# Patient Record
Sex: Female | Born: 1950 | Race: White | Hispanic: No | Marital: Married | Smoking: Never smoker
Health system: Southern US, Community
[De-identification: ages and names within clinical notes are randomized; demographics above are authoritative.]

## PROBLEM LIST (undated history)

## (undated) DIAGNOSIS — I10 Essential (primary) hypertension: Secondary | ICD-10-CM

## (undated) DIAGNOSIS — K219 Gastro-esophageal reflux disease without esophagitis: Secondary | ICD-10-CM

## (undated) DIAGNOSIS — K5900 Constipation, unspecified: Secondary | ICD-10-CM

## (undated) DIAGNOSIS — D649 Anemia, unspecified: Secondary | ICD-10-CM

## (undated) DIAGNOSIS — F32A Depression, unspecified: Secondary | ICD-10-CM

## (undated) DIAGNOSIS — C801 Malignant (primary) neoplasm, unspecified: Secondary | ICD-10-CM

## (undated) DIAGNOSIS — F329 Major depressive disorder, single episode, unspecified: Secondary | ICD-10-CM

## (undated) DIAGNOSIS — M1990 Unspecified osteoarthritis, unspecified site: Secondary | ICD-10-CM

## (undated) DIAGNOSIS — F419 Anxiety disorder, unspecified: Secondary | ICD-10-CM

## (undated) DIAGNOSIS — K589 Irritable bowel syndrome without diarrhea: Secondary | ICD-10-CM

## (undated) DIAGNOSIS — R011 Cardiac murmur, unspecified: Secondary | ICD-10-CM

## (undated) HISTORY — PX: COLONOSCOPY: SHX174

## (undated) HISTORY — PX: CHOLECYSTECTOMY: SHX55

## (undated) HISTORY — PX: OVARIAN CYST REMOVAL: SHX89

## (undated) HISTORY — PX: UMBILICAL HERNIA REPAIR: SHX196

## (undated) HISTORY — PX: TONSILLECTOMY: SUR1361

---

## 2009-09-30 ENCOUNTER — Emergency Department (HOSPITAL_COMMUNITY): Admission: EM | Admit: 2009-09-30 | Discharge: 2009-09-30 | Payer: Self-pay | Admitting: Emergency Medicine

## 2010-05-29 LAB — COMPREHENSIVE METABOLIC PANEL WITH GFR
ALT: 24 U/L (ref 0–35)
AST: 20 U/L (ref 0–37)
Albumin: 4 g/dL (ref 3.5–5.2)
Alkaline Phosphatase: 81 U/L (ref 39–117)
BUN: 16 mg/dL (ref 6–23)
CO2: 28 meq/L (ref 19–32)
Calcium: 9.4 mg/dL (ref 8.4–10.5)
Chloride: 106 meq/L (ref 96–112)
Creatinine, Ser: 0.63 mg/dL (ref 0.4–1.2)
GFR calc non Af Amer: >60 mL/min
Glucose, Bld: 109 mg/dL — ABNORMAL HIGH (ref 70–99)
Potassium: 4.7 meq/L (ref 3.5–5.1)
Sodium: 141 meq/L (ref 135–145)
Total Bilirubin: 1.6 mg/dL — ABNORMAL HIGH (ref 0.3–1.2)
Total Protein: 6.7 g/dL (ref 6.0–8.3)

## 2010-05-29 LAB — URINALYSIS, ROUTINE W REFLEX MICROSCOPIC
Bilirubin Urine: NEGATIVE
Glucose, UA: NEGATIVE mg/dL
Hgb urine dipstick: NEGATIVE
Ketones, ur: NEGATIVE mg/dL
Nitrite: NEGATIVE
Protein, ur: NEGATIVE mg/dL
Specific Gravity, Urine: 1.021 (ref 1.005–1.030)
Urobilinogen, UA: 2 mg/dL — ABNORMAL HIGH (ref 0.0–1.0)
pH: 7.5 (ref 5.0–8.0)

## 2010-05-29 LAB — DIFFERENTIAL
Monocytes Relative: 6 % (ref 3–12)
Neutro Abs: 6 10*3/uL (ref 1.7–7.7)

## 2010-05-29 LAB — CBC
HCT: 45.6 % (ref 36.0–46.0)
Hemoglobin: 15.7 g/dL — ABNORMAL HIGH (ref 12.0–15.0)
MCH: 30 pg (ref 26.0–34.0)
MCHC: 34.5 g/dL (ref 30.0–36.0)
MCV: 87 fL (ref 78.0–100.0)
Platelets: 198 10*3/uL (ref 150–400)
RBC: 5.23 MIL/uL — ABNORMAL HIGH (ref 3.87–5.11)
RDW: 14 % (ref 11.5–15.5)
WBC: 7.9 10*3/uL (ref 4.0–10.5)

## 2010-05-29 LAB — URINE MICROSCOPIC-ADD ON

## 2010-05-29 LAB — LIPASE, BLOOD: Lipase: 31 U/L (ref 11–59)

## 2010-05-29 LAB — HEMOCCULT GUIAC POC 1CARD (OFFICE): Fecal Occult Bld: NEGATIVE

## 2011-03-01 ENCOUNTER — Encounter: Payer: Self-pay | Admitting: Emergency Medicine

## 2011-03-01 ENCOUNTER — Emergency Department (HOSPITAL_COMMUNITY)
Admission: EM | Admit: 2011-03-01 | Discharge: 2011-03-01 | Disposition: A | Discharge status: Home / Self Care | Payer: BC Managed Care – PPO | Attending: Emergency Medicine | Admitting: Emergency Medicine

## 2011-03-01 ENCOUNTER — Emergency Department (HOSPITAL_COMMUNITY): Payer: BC Managed Care – PPO

## 2011-03-01 DIAGNOSIS — R112 Nausea with vomiting, unspecified: Secondary | ICD-10-CM | POA: Insufficient documentation

## 2011-03-01 DIAGNOSIS — F3289 Other specified depressive episodes: Secondary | ICD-10-CM | POA: Insufficient documentation

## 2011-03-01 DIAGNOSIS — R1031 Right lower quadrant pain: Secondary | ICD-10-CM | POA: Insufficient documentation

## 2011-03-01 DIAGNOSIS — F329 Major depressive disorder, single episode, unspecified: Secondary | ICD-10-CM | POA: Insufficient documentation

## 2011-03-01 DIAGNOSIS — R197 Diarrhea, unspecified: Secondary | ICD-10-CM | POA: Insufficient documentation

## 2011-03-01 DIAGNOSIS — I10 Essential (primary) hypertension: Secondary | ICD-10-CM | POA: Insufficient documentation

## 2011-03-01 DIAGNOSIS — K219 Gastro-esophageal reflux disease without esophagitis: Secondary | ICD-10-CM | POA: Insufficient documentation

## 2011-03-01 HISTORY — DX: Gastro-esophageal reflux disease without esophagitis: K21.9

## 2011-03-01 HISTORY — DX: Depression, unspecified: F32.A

## 2011-03-01 HISTORY — DX: Essential (primary) hypertension: I10

## 2011-03-01 HISTORY — DX: Major depressive disorder, single episode, unspecified: F32.9

## 2011-03-01 LAB — CBC
HCT: 41.5 % (ref 36.0–46.0)
Hemoglobin: 14.3 g/dL (ref 12.0–15.0)
MCH: 28.9 pg (ref 26.0–34.0)
MCHC: 34.5 g/dL (ref 30.0–36.0)
MCV: 84 fL (ref 78.0–100.0)
Platelets: 162 K/uL (ref 150–400)
RBC: 4.94 MIL/uL (ref 3.87–5.11)
RDW: 14.4 % (ref 11.5–15.5)
WBC: 9.6 K/uL (ref 4.0–10.5)

## 2011-03-01 LAB — COMPREHENSIVE METABOLIC PANEL
ALT: 17 U/L (ref 0–35)
AST: 9 U/L (ref 0–37)
Alkaline Phosphatase: 78 U/L (ref 39–117)
BUN: 17 mg/dL (ref 6–23)
CO2: 29 mEq/L (ref 19–32)
Chloride: 103 mEq/L (ref 96–112)
GFR calc Af Amer: >90 mL/min (ref >90)
GFR calc non Af Amer: >90 mL/min (ref >90)
Glucose, Bld: 110 mg/dL — ABNORMAL HIGH (ref 70–99)
Potassium: 3.1 mEq/L — ABNORMAL LOW (ref 3.5–5.1)
Sodium: 139 mEq/L (ref 135–145)
Total Bilirubin: 1.5 mg/dL — ABNORMAL HIGH (ref 0.3–1.2)

## 2011-03-01 LAB — URINALYSIS, ROUTINE W REFLEX MICROSCOPIC
Bilirubin Urine: NEGATIVE
Glucose, UA: NEGATIVE mg/dL
Hgb urine dipstick: NEGATIVE
Ketones, ur: NEGATIVE mg/dL
Protein, ur: NEGATIVE mg/dL
Urobilinogen, UA: 0.2 mg/dL (ref 0.0–1.0)
pH: 6 (ref 5.0–8.0)

## 2011-03-01 MED ORDER — ONDANSETRON HCL 4 MG/2ML IJ SOLN: 4.0000 mg | Freq: Once | INTRAMUSCULAR | Status: DC

## 2011-03-01 MED ORDER — IOHEXOL 300 MG/ML  SOLN: 100.0000 mL | Freq: Once | INTRAMUSCULAR | Status: DC | PRN

## 2011-03-01 MED ORDER — MORPHINE SULFATE 4 MG/ML IJ SOLN: 4.0000 mg | Freq: Once | INTRAMUSCULAR | Status: DC

## 2011-03-01 MED ORDER — ONDANSETRON HCL 4 MG PO TABS: 4.0000 mg | ORAL_TABLET | Freq: Four times a day (QID) | ORAL | Status: AC

## 2011-03-01 MED ORDER — IOHEXOL 300 MG/ML  SOLN
100.0000 mL | Freq: Once | INTRAMUSCULAR | Status: AC | PRN
  Administered 2011-03-01: 100 mL via INTRAVENOUS

## 2011-03-01 MED ORDER — SODIUM CHLORIDE 0.9 % IV BOLUS (SEPSIS)
1000.0000 mL | Freq: Once | INTRAVENOUS | Status: AC
  Administered 2011-03-01: 1000 mL via INTRAVENOUS

## 2011-03-01 NOTE — ED Notes (Signed)
MD at bedside. 

## 2011-03-01 NOTE — ED Notes (Signed)
Pt presented to Urgent Care with RLQ pain beginning at 6am today with n/v. Received 8mg  Zofran at clinic. WBC elevated. Margretta Ditty, MD sent pt for eval for appendicitis.

## 2011-03-01 NOTE — ED Notes (Signed)
Pt alert, nad, c/o nausea on this am, pt given Zofran 8mg  pta, 0.9 ns 1 liter pta, states pain in abd 2/10, sent from Urgent care to r/o appendicitis

## 2011-03-01 NOTE — ED Provider Notes (Signed)
History     CSN: [id number] Arrival date & time: 03/01/2011  6:52 PM   First MD Initiated Contact with Patient 03/01/11 1932      Chief Complaint  Patient presents with  . Abdominal Pain  . Nausea  . Emesis    (Consider location/radiation/quality/duration/timing/severity/associated sxs/prior treatment) HPI Patient presents with complaint of nausea vomiting diarrhea and right lower abdominal pain. Symptoms began earlier this morning. She has had multiple episodes of emesis, nonbloody and nonbilious. Diarrhea is watery without blood or mucus. She has had mild discomfort in her right lower abdomen today that pain has now become more diffuse 2 to vomiting. She's had no fever or chills. She denies any urinary symptoms. She has not been able to keep down fluids today. She was seen in urgent care and referred to the ED for further evaluation of her right lower quadrant pain. She's had multiple abdominal surgeries including cholecystectomy C-section. There no other alleviating or modifying factors. There no other associated systemic symptoms.  Pt does state that she feels much improved upon arrival to ED after fluids and antiemetics  Past Medical History  Diagnosis Date  . GERD (gastroesophageal reflux disease)   . Depression   . Hypertension     Past Surgical History  Procedure Date  . Cholecystectomy   . Cesarean section     No family history on file.  History  Substance Use Topics  . Smoking status: Never Smoker   . Smokeless tobacco: Not on file  . Alcohol Use: No    OB History    Grav Para Term Preterm Abortions TAB SAB Ect Mult Living                  Review of Systems ROS reviewed and otherwise negative except for mentioned in HPI  Allergies  Review of patient's allergies indicates no known allergies.  Home Medications   Current Outpatient Rx  Name Route Sig Dispense Refill  . ASPIRIN EC 81 MG PO TBEC Oral Take 81 mg by mouth daily.      Marland Kitchen ESCITALOPRAM  OXALATE 20 MG PO TABS Oral Take 20 mg by mouth daily.      Marland Kitchen ESOMEPRAZOLE MAGNESIUM 40 MG PO CPDR Oral Take 40 mg by mouth daily.     Marland Kitchen FLUTICASONE PROPIONATE 50 MCG/ACT NA SUSP Nasal Place 2 sprays into the nose daily.      Marland Kitchen HYDROCHLOROTHIAZIDE 25 MG PO TABS Oral Take 25 mg by mouth daily.      Marland Kitchen NAPROXEN SODIUM 220 MG PO TABS Oral Take 440 mg by mouth 2 (two) times daily as needed. For pain.     Marland Kitchen SUDAFED PO Oral Take 1 tablet by mouth every 6 (six) hours as needed. For congestion.     Marland Kitchen ONDANSETRON HCL 4 MG PO TABS Oral Take 1 tablet (4 mg total) by mouth every 6 (six) hours. 12 tablet 0    BP 126/47  Pulse 94  Temp(Src) 98 F (36.7 C) (Oral)  Resp 16  SpO2 98% Vitals reviewed Physical Exam Physical Examination: General appearance - alert, well appearing, and in no distress Mental status - alert, oriented to person, place, and time Eyes- PERRL, no scleral icterus Mouth - mucous membranes moist, pharynx normal without lesions Chest - clear to auscultation, no wheezes, rales or rhonchi, symmetric air entry Heart - normal rate, regular rhythm, normal S1, S2, no murmurs, rubs, clicks or gallops Abdomen - soft, mild ttp in right lower abdomen and epigastrium,  nondistended, no masses or organomegaly, no gaurding or rebound tenderness Musculoskeletal - no joint tenderness, deformity or swelling Extremities - peripheral pulses normal, no pedal edema, no clubbing or cyanosis Skin - normal coloration and turgor, no rashes, no suspicious skin lesions noted  ED Course  Procedures (including critical care time)  Labs Reviewed  COMPREHENSIVE METABOLIC PANEL - Abnormal; Notable for the following:    Potassium 3.1 (*)    Glucose, Bld 110 (*)    Total Bilirubin 1.5 (*)    All other components within normal limits  CBC  LIPASE, BLOOD  URINALYSIS, ROUTINE W REFLEX MICROSCOPIC   Ct Abdomen Pelvis W Contrast  03/01/2011  *RADIOLOGY REPORT*  Clinical Data: Right abdominal pain, nausea,  vomiting  CT ABDOMEN AND PELVIS WITH CONTRAST  Technique:  Multidetector CT imaging of the abdomen and pelvis was performed following the standard protocol during bolus administration of intravenous contrast.  Contrast: OMNIPAQUE IOHEXOL 300 MG/ML IV SOLN  Comparison: 09/30/2009  Findings: Minimal dependent atelectasis lung bases.  Tiny hiatal hernia.  Multiple hepatic cysts, the largest measuring 3.6 cm in the left hepatic lobe.  Spleen, pancreas, and adrenal glands are within normal limits.  Status post cholecystectomy.  Common duct measures 9 mm, unchanged.  Punctate nonobstructing right renal calculus (series 2/image 35). Left kidney is within normal limits.  No hydronephrosis.  No evidence of bowel obstruction.  Normal appendix.  No evidence of abdominal aortic aneurysm.  No abdominopelvic ascites.  No suspicious abdominopelvic lymphadenopathy.  Uterus and bilateral ovaries are unremarkable.  Bladder is within normal limits.  Fat-containing umbilical hernia (series 2/image 6).  Mild degenerative changes of the visualized thoracolumbar spine.  IMPRESSION: Punctate nonobstructing right renal calculus.  No hydronephrosis.  Normal appendix.  No evidence of bowel obstruction.  Original Report Authenticated By: Charline Bills, M.D.     1. Nausea vomiting and diarrhea   2. Abdominal pain       MDM  Patient with abdominal pain nausea vomiting and diarrhea. She has no evidence of appendicitis or other acute abnormality on CT scan. She has tolerated fluid in the ED period her vital signs and laboratory evaluation is reassuring except for mild hypokalemia. She is nontoxic and well-hydrated appearing. There is an incidental finding of a punctate renal calculus which I do not believe is causing her symptoms at this time. She was discharged with strict return precautions and is agreeable with this plan.        Ethelda Chick, MD 03/02/11 [phone]

## 2011-03-01 NOTE — ED Notes (Signed)
Lab contacted r/t pt lab work, awaiting results p/t CT

## 2011-03-01 NOTE — ED Notes (Signed)
Xray bedside, PO contrast provided with instruction

## 2011-03-01 NOTE — ED Notes (Signed)
Pt alert, denies needs for pain/nausea medicine, MD made aware, cont to monitor

## 2011-03-01 NOTE — ED Notes (Signed)
ZOX:WRUE<AV> Expected date:03/01/11<BR> Expected time: 6:53 PM<BR> Means of arrival:Ambulance<BR> Comments:<BR> EMS 33 GC - abd pain

## 2011-03-01 NOTE — ED Notes (Signed)
Patient transported to CT 

## 2011-03-01 NOTE — ED Notes (Signed)
Pt moved from recess room B to room 15 for privacy, awaiting eval by ALP

## 2011-10-18 ENCOUNTER — Emergency Department (HOSPITAL_COMMUNITY)
Admission: EM | Admit: 2011-10-18 | Discharge: 2011-10-18 | Disposition: A | Discharge status: Home / Self Care | Payer: BC Managed Care – PPO | Attending: Emergency Medicine | Admitting: Emergency Medicine

## 2011-10-18 ENCOUNTER — Encounter (HOSPITAL_COMMUNITY): Payer: Self-pay | Admitting: *Deleted

## 2011-10-18 DIAGNOSIS — Z23 Encounter for immunization: Secondary | ICD-10-CM | POA: Insufficient documentation

## 2011-10-18 DIAGNOSIS — I10 Essential (primary) hypertension: Secondary | ICD-10-CM | POA: Insufficient documentation

## 2011-10-18 DIAGNOSIS — Z203 Contact with and (suspected) exposure to rabies: Secondary | ICD-10-CM

## 2011-10-18 MED ORDER — RABIES VACCINE, PCEC IM SUSR
1.0000 mL | Freq: Once | INTRAMUSCULAR | Status: AC
  Administered 2011-10-18: 1 mL via INTRAMUSCULAR
  Filled 2011-10-18: qty 1

## 2011-10-18 MED ORDER — RABIES IMMUNE GLOBULIN 150 UNIT/ML IM INJ
20.0000 [IU]/kg | INJECTION | Freq: Once | INTRAMUSCULAR | Status: AC
  Administered 2011-10-18: 1725 [IU] via INTRAMUSCULAR
  Filled 2011-10-18: qty 11.5

## 2011-10-18 NOTE — ED Notes (Signed)
To ED for eval after having a bat in bedroom a cple of nights ago. Told to come to ED for eval

## 2011-10-18 NOTE — ED Provider Notes (Signed)
History  This chart was scribed for Shelda Jakes, MD by Shari Heritage. The patient was seen in room TR09C/TR09C. Patient's care was started at 1402.     CSN: [id number]  Arrival date & time 10/18/11  1402   First MD Initiated Contact with Patient 10/18/11 1538      Chief Complaint  Patient presents with  . Rabies Injection    (Consider location/radiation/quality/duration/timing/severity/associated sxs/prior treatment) The history is provided by the patient. No language interpreter was used.   Kara Ward is a 61 y.o. female who presents to the Emergency Department complaining of possible rabies exposure last Thursday. Patient says she discovered a bat in she and her husband's bedroom 5 days ago. Patient says that they couldn't catch the bat. A few days later, they discovered at open window in their home and think that the bat both entered and exited through this window. She didn't have the opportunity to get the bat tested. She and her husband were referred to the ED by [facility]. Patient denies fever, cough, congestion, chest pain or SOB. No abdominal pain, nausea, vomiting or diarrhea. She has a medical history of GERD, depression and HTN. She has never smoked.  Past Medical History  Diagnosis Date  . GERD (gastroesophageal reflux disease)   . Depression   . Hypertension     Past Surgical History  Procedure Date  . Cholecystectomy   . Cesarean section     History reviewed. No pertinent family history.  History  Substance Use Topics  . Smoking status: Never Smoker   . Smokeless tobacco: Not on file  . Alcohol Use: No    OB History    Grav Para Term Preterm Abortions TAB SAB Ect Mult Living                  Review of Systems  Constitutional: Negative for fever.  HENT: Negative for congestion.   Respiratory: Negative for cough and shortness of breath.   Cardiovascular: Negative for chest pain.  Gastrointestinal: Negative for nausea, vomiting,  abdominal pain and diarrhea.    Allergies  Review of patient's allergies indicates no known allergies.  Home Medications   Current Outpatient Rx  Name Route Sig Dispense Refill  . ESCITALOPRAM OXALATE 10 MG PO TABS Oral Take 10 mg by mouth daily.    Marland Kitchen ESOMEPRAZOLE MAGNESIUM 40 MG PO CPDR Oral Take 40 mg by mouth daily.     Marland Kitchen FLUTICASONE PROPIONATE 50 MCG/ACT NA SUSP Nasal Place 2 sprays into the nose daily as needed. allergies    . HYDROCHLOROTHIAZIDE 25 MG PO TABS Oral Take 25 mg by mouth daily.        BP 149/63  Pulse 61  Temp 98.8 F (37.1 C) (Oral)  Resp 18  Ht 5\' 4"  (1.626 m)  Wt 190 lb 14.4 oz (86.592 kg)  BMI 32.77 kg/m2  SpO2 100%  Physical Exam  Constitutional: She is oriented to person, place, and time.  Cardiovascular: Normal rate and regular rhythm.   No murmur heard. Pulmonary/Chest: Effort normal and breath sounds normal. No respiratory distress. She has no wheezes. She has no rales.  Abdominal: Soft. Bowel sounds are normal. There is no tenderness.  Neurological: She is alert and oriented to person, place, and time. She has normal reflexes. No sensory deficit. She exhibits normal muscle tone. Coordination normal.    ED Course  Procedures (including critical care time) DIAGNOSTIC STUDIES: Oxygen Saturation is 100% on room air, normal by my  interpretation.    COORDINATION OF CARE: 4:00pm- Patient informed of current plan for treatment and evaluation and agrees with plan at this time. Explained at length the rabies vaccination process.   Labs Reviewed - No data to display  No results found.   1. Rabies exposure       MDM  Patient with exposure to rabies from a bat being in the house no bites. However since the bat was in the bedroom appropriate to go to the rabies vaccination process. The bat cannot be located by animal control. And therefore cannot be tested. Patient without any symptoms. Patient received rabies immunoglobulin and first rabies  vaccination here to day.      I personally performed the services described in this documentation, which was scribed in my presence. The recorded information has been reviewed and considered.     Shelda Jakes, MD 10/18/11 [phone]

## 2011-10-21 ENCOUNTER — Emergency Department (HOSPITAL_COMMUNITY)
Admission: EM | Admit: 2011-10-21 | Discharge: 2011-10-21 | Disposition: A | Discharge status: Home / Self Care | Payer: BC Managed Care – PPO | Source: Home / Self Care

## 2011-10-21 ENCOUNTER — Encounter (HOSPITAL_COMMUNITY): Payer: Self-pay | Admitting: *Deleted

## 2011-10-21 DIAGNOSIS — Z23 Encounter for immunization: Secondary | ICD-10-CM

## 2011-10-21 MED ORDER — RABIES VACCINE, PCEC IM SUSR
INTRAMUSCULAR | Status: AC
  Filled 2011-10-21: qty 1

## 2011-10-21 MED ORDER — RABIES VACCINE, PCEC IM SUSR
1.0000 mL | Freq: Once | INTRAMUSCULAR | Status: AC
  Administered 2011-10-21: 1 mL via INTRAMUSCULAR

## 2011-10-21 NOTE — ED Notes (Signed)
Rabies schedule completed and faxed to [facility] and pharmacy x 2. 

## 2011-10-21 NOTE — ED Notes (Signed)
Here for 2nd rabies vaccine for bat exposure.

## 2011-10-24 ENCOUNTER — Telehealth (HOSPITAL_COMMUNITY): Payer: Self-pay | Admitting: *Deleted

## 2011-10-24 NOTE — ED Notes (Signed)
Pt. Called and said she is going to see her PCP for UTI and asked if the antibiotic will affect the rabies shot. I told her I did not think it would. I called the pharmacist to verify this and he agreed. Vassie Moselle 10/24/2011

## 2011-10-25 ENCOUNTER — Encounter (HOSPITAL_COMMUNITY): Payer: Self-pay | Admitting: *Deleted

## 2011-10-25 ENCOUNTER — Emergency Department (INDEPENDENT_AMBULATORY_CARE_PROVIDER_SITE_OTHER)
Admission: EM | Admit: 2011-10-25 | Discharge: 2011-10-25 | Disposition: A | Discharge status: Home / Self Care | Payer: BC Managed Care – PPO | Source: Home / Self Care

## 2011-10-25 DIAGNOSIS — Z23 Encounter for immunization: Secondary | ICD-10-CM

## 2011-10-25 MED ORDER — RABIES VACCINE, PCEC IM SUSR
1.0000 mL | Freq: Once | INTRAMUSCULAR | Status: AC
  Administered 2011-10-25: 1 mL via INTRAMUSCULAR

## 2011-10-25 MED ORDER — RABIES VACCINE, PCEC IM SUSR
INTRAMUSCULAR | Status: AC
  Filled 2011-10-25: qty 1

## 2011-10-25 NOTE — ED Notes (Signed)
Here for 3 rd rabies vaccine for bat exposure. C/o feeling tired for 3 days after last shot.

## 2011-11-01 ENCOUNTER — Emergency Department (INDEPENDENT_AMBULATORY_CARE_PROVIDER_SITE_OTHER)
Admission: EM | Admit: 2011-11-01 | Discharge: 2011-11-01 | Disposition: A | Discharge status: Home / Self Care | Payer: BC Managed Care – PPO | Source: Home / Self Care

## 2011-11-01 ENCOUNTER — Encounter (HOSPITAL_COMMUNITY): Payer: Self-pay | Admitting: *Deleted

## 2011-11-01 DIAGNOSIS — Z23 Encounter for immunization: Secondary | ICD-10-CM

## 2011-11-01 MED ORDER — RABIES VACCINE, PCEC IM SUSR
1.0000 mL | Freq: Once | INTRAMUSCULAR | Status: AC
  Administered 2011-11-01: 1 mL via INTRAMUSCULAR

## 2011-11-01 MED ORDER — RABIES VACCINE, PCEC IM SUSR
INTRAMUSCULAR | Status: AC
  Filled 2011-11-01: qty 1

## 2011-11-01 NOTE — ED Notes (Addendum)
Pt. given Imovax from [employer]- mfg. on 8/13.  Not [employer].  Exp. Date correct.

## 2011-11-01 NOTE — ED Notes (Signed)
States she felt tired for a few days after the injection.

## 2011-11-01 NOTE — ED Notes (Signed)
Here for 4th rabies vaccine for bat exposure.

## 2019-10-09 ENCOUNTER — Other Ambulatory Visit (HOSPITAL_COMMUNITY): Payer: Self-pay | Admitting: Orthopedic Surgery

## 2019-10-09 ENCOUNTER — Other Ambulatory Visit: Payer: Self-pay | Admitting: Orthopedic Surgery

## 2019-10-09 DIAGNOSIS — M546 Pain in thoracic spine: Secondary | ICD-10-CM

## 2019-10-09 DIAGNOSIS — M542 Cervicalgia: Secondary | ICD-10-CM

## 2019-10-25 ENCOUNTER — Encounter (HOSPITAL_COMMUNITY): Payer: Self-pay | Admitting: *Deleted

## 2019-10-28 ENCOUNTER — Encounter (HOSPITAL_COMMUNITY): Payer: Self-pay | Admitting: *Deleted

## 2019-10-28 ENCOUNTER — Other Ambulatory Visit: Payer: Self-pay

## 2019-10-28 NOTE — Progress Notes (Signed)
Spoke with pt for pre-op call. Pt states she has been told in the last few years that she has a "slight, benign" heart murmur. Denies any other cardiac disease.   Pt will need Covid test done on arrival. She [place] out of town.

## 2019-10-28 NOTE — Progress Notes (Signed)
Anesthesia Chart Review: Kara Ward   Case: [id number] Date/Time: 10/29/19 0745   Procedure: MRI WITH ANESTHESIA CERVIAL SPINE WITHOUT CONTRAST, THORACIC SPINE WITHOUT CONTRAST (N/A )   Anesthesia type: General   Pre-op diagnosis: CERVICAL AND THORACIC PAIN   Location: [facility] OR RADIOLOGY ROOM / [place]   Surgeons: Radiologist, Medication, MD      DISCUSSION: Patient is a 69 year old female scheduled for MRI of the cervical and thoracic spines on 10/29/2019.  Imaging was ordered by Yolande Jolly, MD. H&P completed by Edmund Hilda, PA-C on 10/23/19 of with [facility] (scanned under Media tab, "Radiology Order" 10/24/19).  History as outlined in [facility] and [facility] includes never smoker, GERD, HTN, HLD, palpitations, bradycardia, murmur ("faint 1/6 systolic ejection murmur at the right 2nd interspace heard only with the patient sitting.Marland KitchenMarland KitchenEchocardiogram not indicated", 05/14/19 cardiologist Dr. Perry Mount), anxiety, chronic fatigue, cholecystectomy, incisional hernia repair (03/15/17).  She had a negative SARS-CoV-2 (APTIMA) test on 10/26/19 ([facility] the [facility]). Anesthesia team to evaluate on the day of procedure.   VS: As of 09/05/19 ([place]): Blood Pressure 132/80 09/05/2019 4:40 PM EDT   Pulse 72 09/05/2019 4:40 PM EDT   Temperature 36.7 C (98.1 F) 09/05/2019 4:40 PM EDT   Respiratory Rate 18 09/05/2019 4:40 PM EDT   Oxygen Saturation 98% 09/05/2019 4:40 PM EDT   Inhaled Oxygen Concentration - -   Weight 77.1 kg (170 lb) 09/05/2019 4:40 PM EDT   Height 160 cm (_0 ) 09/05/2019 4:40 PM EDT   Body Mass Index 30.11 09/05/2019 4:40 PM EDT      PROVIDERS: Lottie Dawson, NP is PCP ([place]; see Care Everywhere) Joan Flores, DO is cardiologist [facility]; see Care Everywhere). Last visit 05/14/19. Lisinopril increased due to SBPs ranging in the  140's. 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 Echocardiogram not indicated".  Occasional palpitations, thought to be related to rare PACs and PVCs seen on 2018 Holter monitor. One year follow-up recommended.    LABS: For day of procedure as indicated. Comparison labs from 07/12/19 can be viewed in [place] of the [facility] and show Cr 0.61, Alk Ph 68, ALT 17, AST 13, total bilirubin 2.00 and A1c 4.4%; CBC 02/04/19 was WNL.    IMAGES: CT Head 09/03/19 ([facility]): Impression: Age-appropriate noncontrast head CT. No evidence for acute intracranial pathology.    EKG: 05/14/19 [facility]): SB at 50 bpm. LAD.    CV: 24 Hour Holter monitor 04/04/16 ([facility]):  - The rhythm is sinus throughout with an average heart rate of 56 per  minute.  - One PVC was seen in this was noted to be a fusion PVC.  - Fifty-eight isolated premature atrial beats were noted with 1 atrial  couplet. There were 3 atrial triplets.  - No significant tachy or bradyarrhythmia, low heart rate during sleep was  42 per minute, sinus. High heart rate was 96 per minute at 12:37 p.m.   Nuclear stress test 03/09/16 ([facility]): Impression: Normal Rest/Stress myocardial perfusion SPECT study demonstrating no perfusion evidence of ischemia or infarction. Left ventricular wall motion is normal. LVEF 71% post stress. This is borderline  incomplete, but low risk for evidence of ischemia and no evidence of infarction      Past Medical History:  Diagnosis Date  . Depression   . GERD (gastroesophageal reflux disease)   . Hypertension     Past  Surgical History:  Procedure Laterality Date  . CESAREAN SECTION     x 2  . CHOLECYSTECTOMY    . OVARIAN CYST REMOVAL    . TONSILLECTOMY      MEDICATIONS: No current facility-administered medications for this encounter.   Marland Kitchen acetaminophen (TYLENOL) 500 MG tablet  . acidophilus (RISAQUAD) CAPS capsule  .  ALPRAZolam (XANAX) 0.5 MG tablet  . cholecalciferol (VITAMIN D3) 25 MCG (1000 UNIT) tablet  . escitalopram (LEXAPRO) 20 MG tablet  . famotidine (PEPCID) 20 MG tablet  . lisinopril (ZESTRIL) 10 MG tablet  . RABEprazole (ACIPHEX) 20 MG tablet  . Wheat Dextrin (BENEFIBER PO)  . esomeprazole (NEXIUM) 40 MG capsule  . fluticasone (FLONASE) 50 MCG/ACT nasal spray  . hydrochlorothiazide (HYDRODIURIL) 25 MG tablet  . sulfamethoxazole-trimethoprim (BACTRIM DS) 800-160 MG per tablet    Myra Gianotti, PA-C Surgical Short Stay/Anesthesiology [facility] Phone [phone] [facility] Phone [phone] 10/28/2019 9:58 AM

## 2019-10-28 NOTE — H&P (View-Only) (Signed)
Anesthesia Chart Review: Kara Ward   Case: [id number] Date/Time: 10/29/19 0745   Procedure: MRI WITH ANESTHESIA CERVIAL SPINE WITHOUT CONTRAST, THORACIC SPINE WITHOUT CONTRAST (N/A )   Anesthesia type: General   Pre-op diagnosis: CERVICAL AND THORACIC PAIN   Location: [facility] OR RADIOLOGY ROOM / [place]   Surgeons: Radiologist, Medication, MD      DISCUSSION: Patient is a 69 year old female scheduled for MRI of the cervical and thoracic spines on 10/29/2019.  Imaging was ordered by Yolande Jolly, MD. H&P completed by Edmund Hilda, PA-C on 10/23/19 of with [facility] (scanned under Media tab, "Radiology Order" 10/24/19).  History as outlined in [facility] and [facility] includes never smoker, GERD, HTN, HLD, palpitations, bradycardia, murmur ("faint 1/6 systolic ejection murmur at the right 2nd interspace heard only with the patient sitting.Marland KitchenMarland KitchenEchocardiogram not indicated", 05/14/19 cardiologist Dr. Perry Mount), anxiety, chronic fatigue, cholecystectomy, incisional hernia repair (03/15/17).  She had a negative SARS-CoV-2 (APTIMA) test on 10/26/19 ([facility]). Anesthesia team to evaluate on the day of procedure.   VS: As of 09/05/19 ([place]): Blood Pressure 132/80 09/05/2019 4:40 PM EDT   Pulse 72 09/05/2019 4:40 PM EDT   Temperature 36.7 C (98.1 F) 09/05/2019 4:40 PM EDT   Respiratory Rate 18 09/05/2019 4:40 PM EDT   Oxygen Saturation 98% 09/05/2019 4:40 PM EDT   Inhaled Oxygen Concentration - -   Weight 77.1 kg (170 lb) 09/05/2019 4:40 PM EDT   Height 160 cm (_0 ) 09/05/2019 4:40 PM EDT   Body Mass Index 30.11 09/05/2019 4:40 PM EDT      PROVIDERS: Lottie Dawson, NP is PCP ([place]; see Care Everywhere) Joan Flores, DO is cardiologist [facility]; see Care Everywhere). Last visit 05/14/19. Lisinopril increased due to SBPs ranging in the  140's. 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 Echocardiogram not indicated".  Occasional palpitations, thought to be related to rare PACs and PVCs seen on 2018 Holter monitor. One year follow-up recommended.    LABS: For day of procedure as indicated. Comparison labs from 07/12/19 can be viewed in [place] of the [facility] and show Cr 0.61, Alk Ph 68, ALT 17, AST 13, total bilirubin 2.00 and A1c 4.4%; CBC 02/04/19 was WNL.    IMAGES: CT Head 09/03/19 ([facility]): Impression: Age-appropriate noncontrast head CT. No evidence for acute intracranial pathology.    EKG: 05/14/19 [facility]): SB at 50 bpm. LAD.    CV: 24 Hour Holter monitor 04/04/16 ([facility]):  - The rhythm is sinus throughout with an average heart rate of 56 per  minute.  - One PVC was seen in this was noted to be a fusion PVC.  - Fifty-eight isolated premature atrial beats were noted with 1 atrial  couplet. There were 3 atrial triplets.  - No significant tachy or bradyarrhythmia, low heart rate during sleep was  42 per minute, sinus. High heart rate was 96 per minute at 12:37 p.m.   Nuclear stress test 03/09/16 ([facility]): Impression: Normal Rest/Stress myocardial perfusion SPECT study demonstrating no perfusion evidence of ischemia or infarction. Left ventricular wall motion is normal. LVEF 71% post stress. This is borderline  incomplete, but low risk for evidence of ischemia and no evidence of infarction      Past Medical History:  Diagnosis Date  . Depression   . GERD (gastroesophageal reflux disease)   . Hypertension     Past  Surgical History:  Procedure Laterality Date  . CESAREAN SECTION     x 2  . CHOLECYSTECTOMY    . OVARIAN CYST REMOVAL    . TONSILLECTOMY      MEDICATIONS: No current facility-administered medications for this encounter.   Marland Kitchen acetaminophen (TYLENOL) 500 MG tablet  . acidophilus (RISAQUAD) CAPS capsule  .  ALPRAZolam (XANAX) 0.5 MG tablet  . cholecalciferol (VITAMIN D3) 25 MCG (1000 UNIT) tablet  . escitalopram (LEXAPRO) 20 MG tablet  . famotidine (PEPCID) 20 MG tablet  . lisinopril (ZESTRIL) 10 MG tablet  . RABEprazole (ACIPHEX) 20 MG tablet  . Wheat Dextrin (BENEFIBER PO)  . esomeprazole (NEXIUM) 40 MG capsule  . fluticasone (FLONASE) 50 MCG/ACT nasal spray  . hydrochlorothiazide (HYDRODIURIL) 25 MG tablet  . sulfamethoxazole-trimethoprim (BACTRIM DS) 800-160 MG per tablet    Myra Gianotti, PA-C Surgical Short Stay/Anesthesiology [facility] Phone [phone] [facility] Phone [phone] 10/28/2019 9:58 AM

## 2019-10-28 NOTE — Anesthesia Preprocedure Evaluation (Addendum)
Anesthesia Evaluation  Patient identified by MRN, date of birth, ID band Patient awake    Reviewed: Allergy & Precautions, NPO status , Patient's Chart, lab work & pertinent test results  History of Anesthesia Complications Negative for: history of anesthetic complications  Airway Mallampati: II  TM Distance: >3 FB Neck ROM: Full    Dental  (+) Dental Advisory Given   Pulmonary neg pulmonary ROS,    Pulmonary exam normal        Cardiovascular hypertension, Pt. on medications Normal cardiovascular exam     Neuro/Psych PSYCHIATRIC DISORDERS Anxiety Depression negative neurological ROS     GI/Hepatic Neg liver ROS, GERD  Medicated and Controlled, IBS    Endo/Other  negative endocrine ROS  Renal/GU negative Renal ROS     Musculoskeletal  (+) Arthritis ,  Back pain   Abdominal   Peds  Hematology negative hematology ROS (+)   Anesthesia Other Findings Covid test negative   Reproductive/Obstetrics                           Anesthesia Physical Anesthesia Plan  ASA: II  Anesthesia Plan: General   Post-op Pain Management:    Induction: Intravenous  PONV Risk Score and Plan: 3 and Treatment may vary due to age or medical condition, Ondansetron and Dexamethasone  Airway Management Planned: LMA  Additional Equipment: None  Intra-op Plan:   Post-operative Plan: Extubation in OR  Informed Consent: I have reviewed the patients History and Physical, chart, labs and discussed the procedure including the risks, benefits and alternatives for the proposed anesthesia with the patient or authorized representative who has indicated his/her understanding and acceptance.     Dental advisory given  Plan Discussed with: CRNA and Anesthesiologist  Anesthesia Plan Comments:       Anesthesia Quick Evaluation

## 2019-10-29 ENCOUNTER — Ambulatory Visit (HOSPITAL_COMMUNITY): Payer: Medicare PPO | Admitting: Vascular Surgery

## 2019-10-29 ENCOUNTER — Ambulatory Visit (HOSPITAL_COMMUNITY)
Admission: RE | Admit: 2019-10-29 | Discharge: 2019-10-29 | Disposition: A | Discharge status: Home / Self Care | Payer: Medicare PPO | Source: Ambulatory Visit | Attending: Orthopedic Surgery | Admitting: Orthopedic Surgery

## 2019-10-29 ENCOUNTER — Encounter (HOSPITAL_COMMUNITY): Payer: Self-pay

## 2019-10-29 ENCOUNTER — Encounter (HOSPITAL_COMMUNITY): Admission: RE | Disposition: A | Discharge status: Home / Self Care | Payer: Self-pay | Source: Home / Self Care

## 2019-10-29 ENCOUNTER — Ambulatory Visit (HOSPITAL_COMMUNITY)
Admission: RE | Admit: 2019-10-29 | Discharge: 2019-10-29 | Disposition: A | Discharge status: Home / Self Care | Payer: Medicare PPO | Attending: Orthopedic Surgery | Admitting: Orthopedic Surgery

## 2019-10-29 DIAGNOSIS — F329 Major depressive disorder, single episode, unspecified: Secondary | ICD-10-CM | POA: Insufficient documentation

## 2019-10-29 DIAGNOSIS — F40240 Claustrophobia: Secondary | ICD-10-CM | POA: Insufficient documentation

## 2019-10-29 DIAGNOSIS — Z20822 Contact with and (suspected) exposure to covid-19: Secondary | ICD-10-CM | POA: Diagnosis not present

## 2019-10-29 DIAGNOSIS — Z79899 Other long term (current) drug therapy: Secondary | ICD-10-CM | POA: Insufficient documentation

## 2019-10-29 DIAGNOSIS — G96191 Perineural cyst: Secondary | ICD-10-CM | POA: Diagnosis not present

## 2019-10-29 DIAGNOSIS — E785 Hyperlipidemia, unspecified: Secondary | ICD-10-CM | POA: Diagnosis not present

## 2019-10-29 DIAGNOSIS — M542 Cervicalgia: Secondary | ICD-10-CM

## 2019-10-29 DIAGNOSIS — M546 Pain in thoracic spine: Secondary | ICD-10-CM

## 2019-10-29 DIAGNOSIS — I10 Essential (primary) hypertension: Secondary | ICD-10-CM | POA: Diagnosis not present

## 2019-10-29 DIAGNOSIS — K219 Gastro-esophageal reflux disease without esophagitis: Secondary | ICD-10-CM | POA: Insufficient documentation

## 2019-10-29 HISTORY — DX: Unspecified osteoarthritis, unspecified site: M19.90

## 2019-10-29 HISTORY — DX: Anemia, unspecified: D64.9

## 2019-10-29 HISTORY — DX: Malignant (primary) neoplasm, unspecified: C80.1

## 2019-10-29 HISTORY — PX: RADIOLOGY WITH ANESTHESIA: SHX6223

## 2019-10-29 HISTORY — DX: Irritable bowel syndrome, unspecified: K58.9

## 2019-10-29 HISTORY — DX: Cardiac murmur, unspecified: R01.1

## 2019-10-29 HISTORY — DX: Constipation, unspecified: K59.00

## 2019-10-29 HISTORY — DX: Anxiety disorder, unspecified: F41.9

## 2019-10-29 HISTORY — DX: Irritable bowel syndrome without diarrhea: K58.9

## 2019-10-29 LAB — BASIC METABOLIC PANEL
Anion gap: 7 (ref 5–15)
BUN: 9 mg/dL (ref 8–23)
CO2: 29 mmol/L (ref 22–32)
Calcium: 9.6 mg/dL (ref 8.9–10.3)
Chloride: 106 mmol/L (ref 98–111)
Creatinine, Ser: 0.61 mg/dL (ref 0.44–1.00)
GFR calc Af Amer: >60 mL/min (ref >60)
GFR calc non Af Amer: >60 mL/min (ref >60)
Glucose, Bld: 106 mg/dL — ABNORMAL HIGH (ref 70–99)
Potassium: 4.3 mmol/L (ref 3.5–5.1)
Sodium: 142 mmol/L (ref 135–145)

## 2019-10-29 LAB — SARS CORONAVIRUS 2 BY RT PCR (HOSPITAL ORDER, PERFORMED IN ~~LOC~~ HOSPITAL LAB): SARS Coronavirus 2: NEGATIVE

## 2019-10-29 SURGERY — MRI WITH ANESTHESIA
Anesthesia: General

## 2019-10-29 MED ORDER — ONDANSETRON HCL 4 MG/2ML IJ SOLN: 4.0000 mg | Freq: Once | INTRAMUSCULAR | Status: DC | PRN

## 2019-10-29 MED ORDER — LACTATED RINGERS IV SOLN: INTRAVENOUS | Status: DC | PRN

## 2019-10-29 MED ORDER — LACTATED RINGERS IV SOLN: INTRAVENOUS | Status: DC

## 2019-10-29 MED ORDER — CHLORHEXIDINE GLUCONATE 0.12 % MT SOLN: 15.0000 mL | Freq: Once | OROMUCOSAL | Status: AC

## 2019-10-29 MED ORDER — CHLORHEXIDINE GLUCONATE 0.12 % MT SOLN
OROMUCOSAL | Status: AC
  Administered 2019-10-29: 15 mL via OROMUCOSAL
  Filled 2019-10-29: qty 15

## 2019-10-29 MED ORDER — ORAL CARE MOUTH RINSE: 15.0000 mL | Freq: Once | OROMUCOSAL | Status: AC

## 2019-10-29 NOTE — Anesthesia Postprocedure Evaluation (Signed)
Anesthesia Post Note  Patient: Kara Ward  Procedure(s) Performed: MRI WITH ANESTHESIA CERVIAL SPINE WITHOUT CONTRAST, THORACIC SPINE WITHOUT CONTRAST (N/A )     Patient location during evaluation: PACU Anesthesia Type: General Level of consciousness: awake and alert Pain management: pain level controlled Vital Signs Assessment: post-procedure vital signs reviewed and stable Respiratory status: spontaneous breathing, nonlabored ventilation and respiratory function stable Cardiovascular status: blood pressure returned to baseline and stable Postop Assessment: no apparent nausea or vomiting Anesthetic complications: no   No complications documented.  Last Vitals:  Vitals:   10/29/19 1005 10/29/19 1015  BP: (!) 136/57   Pulse: (!) 51   Resp: 15   Temp:  36.7 C  SpO2: 99%     Last Pain:  Vitals:   10/29/19 0950  TempSrc:   PainSc: 0-No pain                 Audry Pili

## 2019-10-29 NOTE — Transfer of Care (Signed)
Immediate Anesthesia Transfer of Care Note  Patient: Kara Ward  Procedure(s) Performed: MRI WITH ANESTHESIA CERVIAL SPINE WITHOUT CONTRAST, THORACIC SPINE WITHOUT CONTRAST (N/A )  Patient Location: PACU  Anesthesia Type:General  Level of Consciousness: awake, alert , oriented and patient cooperative  Airway & Oxygen Therapy: Patient Spontanous Breathing and Patient connected to nasal cannula oxygen  Post-op Assessment: Report given to RN and Post -op Vital signs reviewed and stable  Post vital signs: Reviewed and stable  Last Vitals:  Vitals Value Taken Time  BP 138/66 10/29/19 0951  Temp 36.3 C 10/29/19 0950  Pulse 51 10/29/19 0951  Resp 13 10/29/19 0951  SpO2 100 % 10/29/19 0951  Vitals shown include unvalidated device data.  Last Pain:  Vitals:   10/29/19 0950  TempSrc:   PainSc: 0-No pain         Complications: No complications documented.

## 2019-10-29 NOTE — Interval H&P Note (Signed)
Anesthesia H&P Update: History and Physical Exam reviewed; patient is OK for planned anesthetic and procedure. ? ?

## 2019-10-30 ENCOUNTER — Encounter (HOSPITAL_COMMUNITY): Payer: Self-pay | Admitting: Radiology

## 2019-11-05 MED FILL — Dexamethasone Sodium Phosphate Inj 10 MG/ML: INTRAMUSCULAR | Qty: 1 | Status: AC

## 2019-11-05 MED FILL — Midazolam HCl Inj 2 MG/2ML (Base Equivalent): INTRAMUSCULAR | Qty: 2 | Status: AC

## 2019-11-05 MED FILL — Ondansetron HCl Inj 4 MG/2ML (2 MG/ML): INTRAMUSCULAR | Qty: 2 | Status: AC

## 2019-11-05 MED FILL — Lidocaine HCl Local Soln Prefilled Syringe 100 MG/5ML (2%): INTRAMUSCULAR | Qty: 5 | Status: AC

## 2019-11-05 MED FILL — Propofol IV Emul 200 MG/20ML (10 MG/ML): INTRAVENOUS | Qty: 15 | Status: AC

## 2021-10-10 IMAGING — MR MR CERVICAL SPINE W/O CM
4 of 5 series · 18 of 48 positions shown · non-contrast
Comparison: None.

CLINICAL DATA: Pain

EXAM:
MRI CERVICAL AND THORACIC SPINE WITHOUT CONTRAST
TECHNIQUE: Multiplanar and multiecho pulse sequences of the cervical spine, to
include the craniocervical junction and cervicothoracic junction,
and thoracic spine, were obtained without intravenous contrast.

[Series 3: T2 · sagittal · 3.0mm · 0.43mm/px · 5 of 15 slices shown (1 of 2)]
[im 1/15]
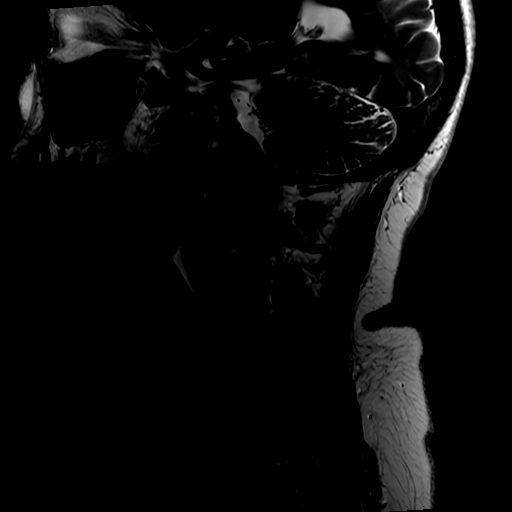
[im 4/15]
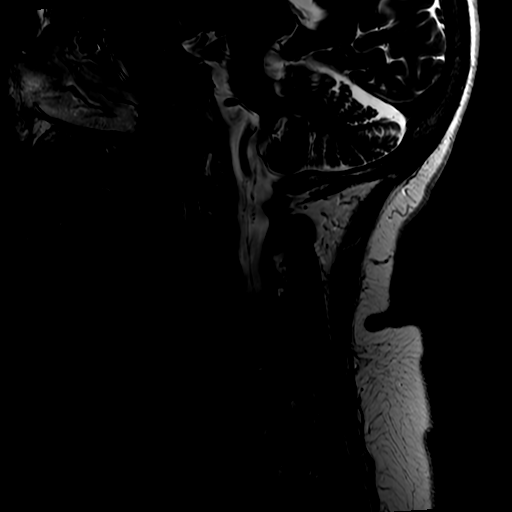
[im 8/15]
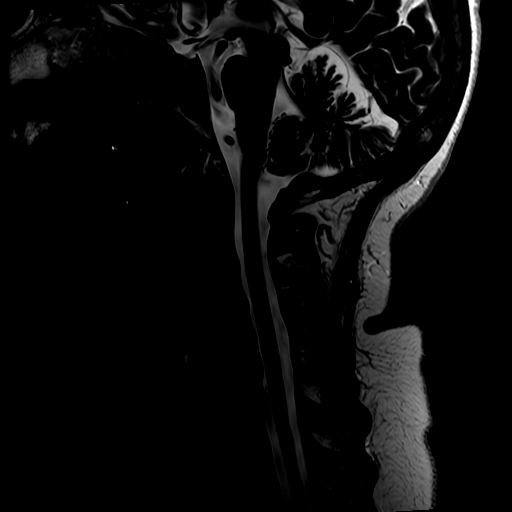
[im 11/15]
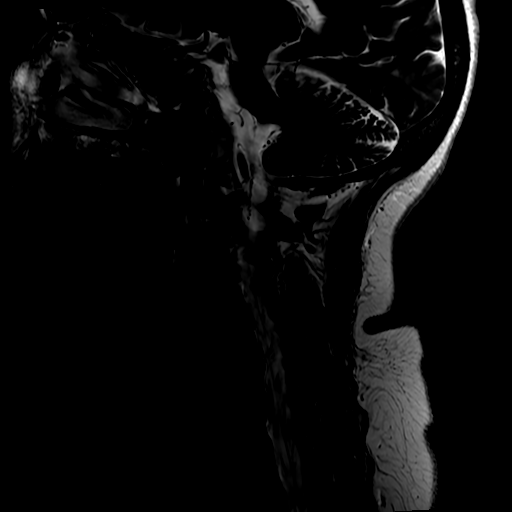
[im 15/15]
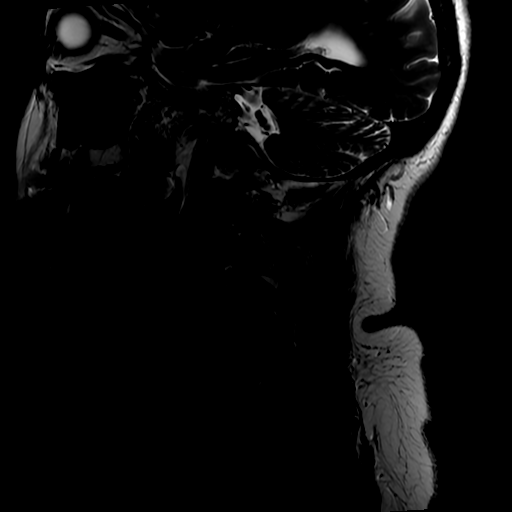

[Series 4: FLAIR · sagittal · 3.0mm · 0.43mm/px · 3 of 15 slices shown]
[im 1/15]
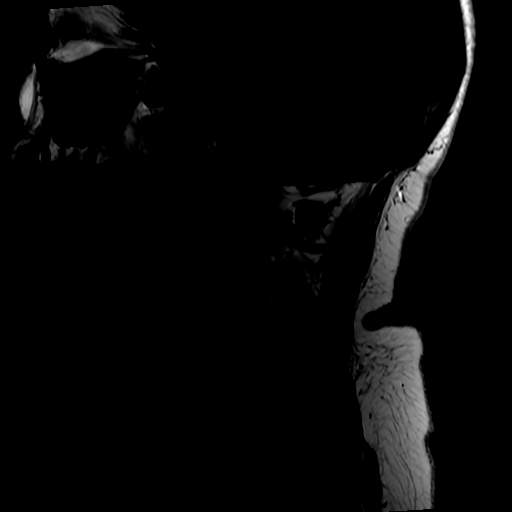
[im 8/15]
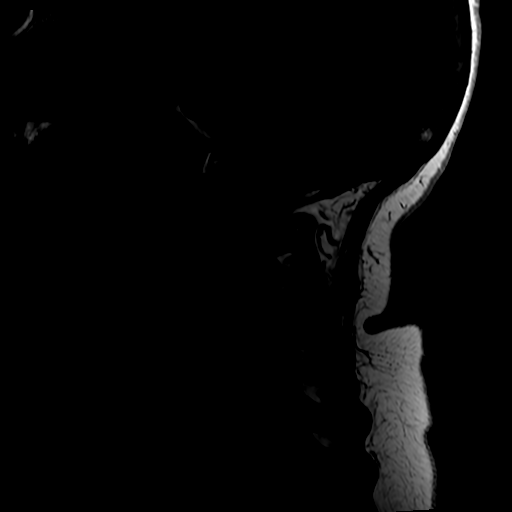
[im 15/15]
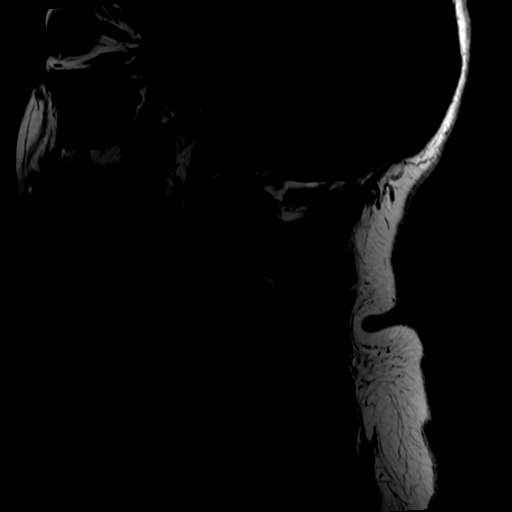

[Series 5: STIR · sagittal · 3.0mm · 0.43mm/px · 3 of 15 slices shown]
[im 1/15]
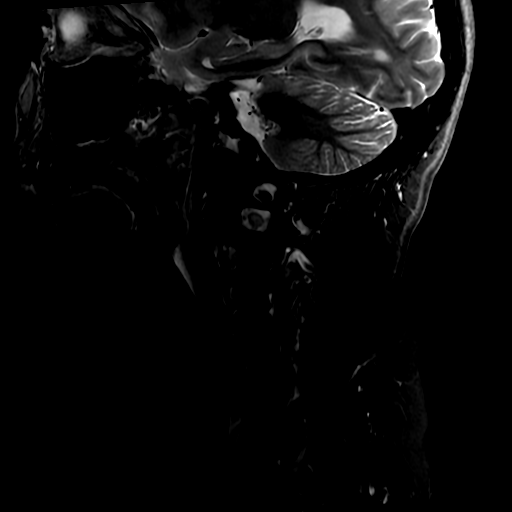
[im 8/15]
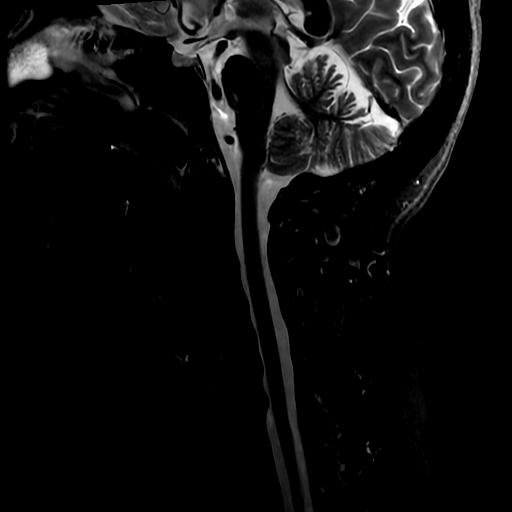
[im 15/15]
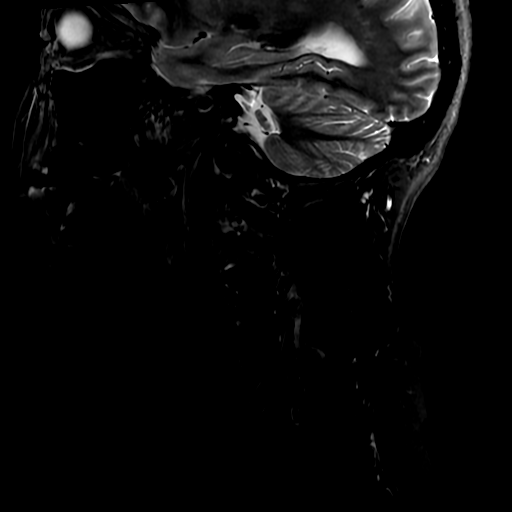

[Series 7: T2 · axial · 3.0mm · 0.35mm/px · z∈[-118,-52]mm · 7 of 25 slices shown (2 of 2)]
[im 1/25]
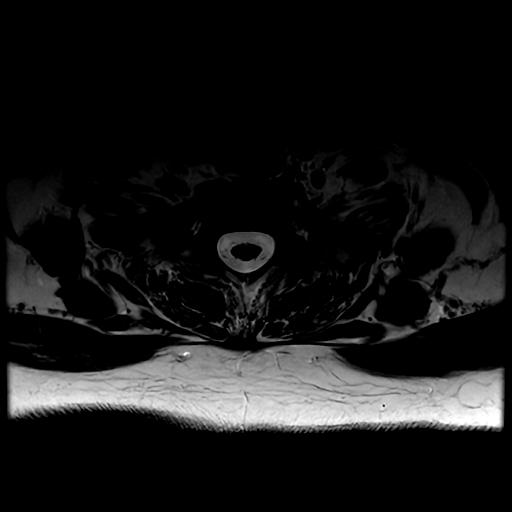
[im 4/25]
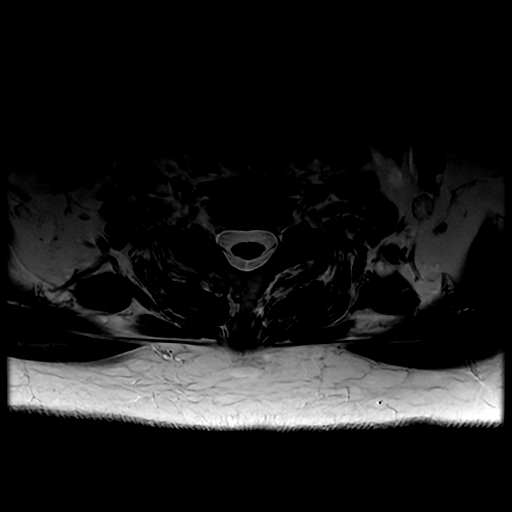
[im 7/25]
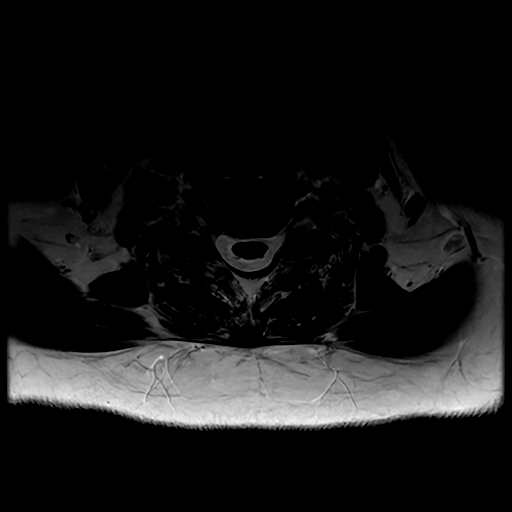
[im 11/25]
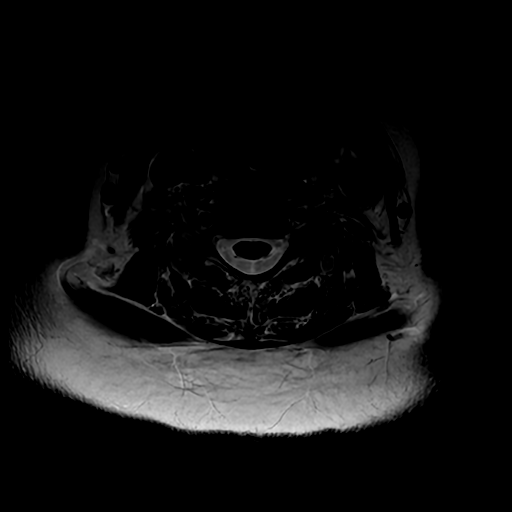
[im 14/25]
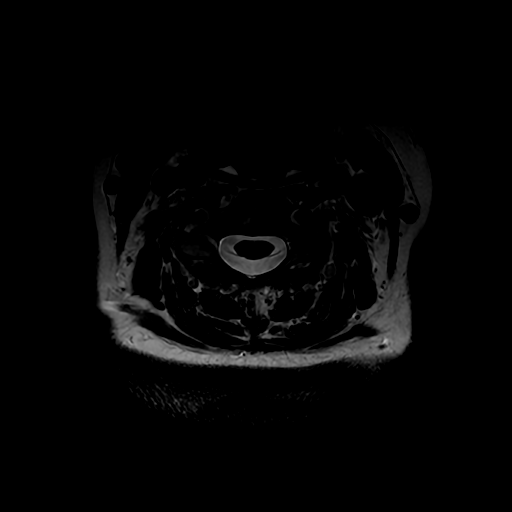
[im 18/25]
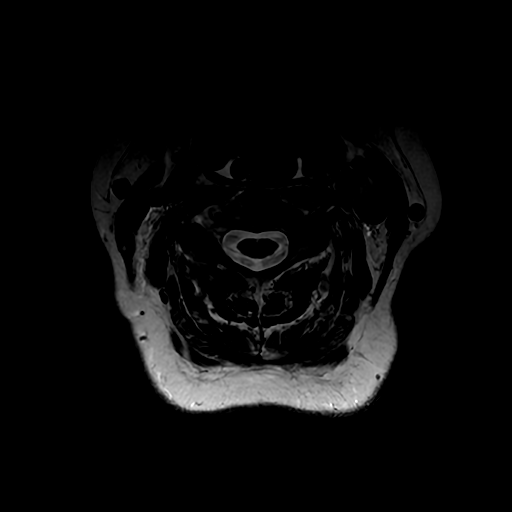
[im 21/25]
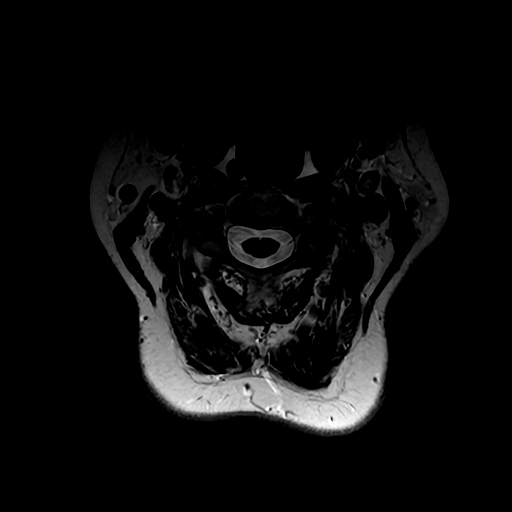

[18 of 48 positions shown; findings below may reference images not displayed]

FINDINGS: MRI CERVICAL SPINE FINDINGS

Alignment: Straightening of lordosis.

Vertebrae: Normal bone marrow signal intensity. No focal osseous
lesion.

Cord: Normal signal and morphology.

Posterior Fossa, vertebral arteries: Negative.

Disc levels: Multilevel desiccation. Mild disc space loss at the
C5-6 level.

C2-3: No significant spinal canal or neural foraminal narrowing.

C3-4: No significant spinal canal or neural foraminal narrowing

C4-5: Small disc osteophyte complex with tiny central protrusion and
uncovertebral degenerative spurring. No significant spinal canal or
neural foraminal narrowing.

C5-6: Disc osteophyte complex partially effacing the ventral CSF
containing spaces. Bilateral uncovertebral degenerative spurring.
Patent spinal canal. Mild bilateral neural foraminal narrowing.

C6-7: Disc bulge with superimposed right paracentral protrusion,
uncovertebral and bilateral facet degenerative spurring. Partial
effacement of the ventral CSF containing spaces. Patent spinal canal
and right neural foramen. Mild left neural foraminal narrowing.

C7-T1: No significant spinal canal or neural foraminal narrowing.

Paraspinal tissues: Within normal limits.

MRI THORACIC SPINE FINDINGS

Alignment:  Normal.

Vertebrae: Normal bone marrow signal intensity. Scattered T1/T2
hyperintense foci likely reflect hemangiomata versus focal fat.

Cord:  Normal signal and morphology.

Paraspinal and other soft tissues: Paraspinal soft tissues within
normal limits. Multifocal hepatic cysts measuring up to 3.9 cm
([DATE]). Right apical scarring. Dependent atelectasis.

Disc levels:

Multilevel desiccation however disc spaces are preserved. No
significant spinal canal or neural foraminal narrowing. Sub 5 mm
bilateral perineural cysts at the T8-9 level.
IMPRESSION: MRI cervical spine:

Mild bilateral C5-6 and left C6-7 neural foraminal narrowing.

No significant spinal canal narrowing.

MRI thoracic spine:

No significant spinal canal or neural foraminal narrowing.

Jub-D mm bilateral T8-9 perineural cyst.

Right hepatic cysts measuring up to 3.9 cm.
